# Patient Record
Sex: Male | Born: 1995 | Race: Black or African American | Hispanic: No | Marital: Single | Smoking: Never smoker
Health system: Southern US, Community
[De-identification: ages and names within clinical notes are randomized; demographics above are authoritative.]

---
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
## 2022-12-20 ENCOUNTER — Ambulatory Visit: Payer: Self-pay | Admitting: Nurse Practitioner

## 2023-07-06 ENCOUNTER — Emergency Department (HOSPITAL_COMMUNITY)
Admission: EM | Admit: 2023-07-06 | Discharge: 2023-07-06 | Disposition: A | Discharge status: Home / Self Care | Payer: Self-pay | Attending: Emergency Medicine | Admitting: Emergency Medicine

## 2023-07-06 ENCOUNTER — Encounter (HOSPITAL_COMMUNITY): Payer: Self-pay

## 2023-07-06 ENCOUNTER — Other Ambulatory Visit: Payer: Self-pay

## 2023-07-06 DIAGNOSIS — L308 Other specified dermatitis: Secondary | ICD-10-CM | POA: Insufficient documentation

## 2023-07-06 MED ORDER — TRIAMCINOLONE ACETONIDE 0.1 % EX CREA: 1.0000 | TOPICAL_CREAM | Freq: Two times a day (BID) | CUTANEOUS | 0 refills | Status: AC

## 2023-07-06 MED ORDER — PREDNISONE 10 MG (21) PO TBPK: ORAL_TABLET | Freq: Every day | ORAL | 0 refills | Status: AC

## 2023-07-06 NOTE — ED Provider Notes (Signed)
[facility] AT [facility] Provider Note   CSN: [id number] Arrival date & time: 07/06/23  0932     History  Chief Complaint  Patient presents with   Rash    Marcus Cohen is a 27 y.o. male with past medical history significant for eczema who presents concern for eczema flare.  Patient reports that typically he would get some eczema in elbows and other skin folds but that it has worsened and seems to be affecting almost his entire body.  Patient denies any new detergents, soaps, he reports he moisturizes with Aveeno 2-3 times daily.  He is not currently using any topical steroid.  He denies taking hot showers.  He denies any suspicion for other allergy contacts.  He does not currently have a dermatologist.   Rash      Home Medications Prior to Admission medications   Medication Sig Start Date End Date Taking? Authorizing Provider  predniSONE (STERAPRED UNI-PAK 21 TAB) 10 MG (21) TBPK tablet Take by mouth daily. Take 6 tabs by mouth daily  for 2 days, then 5 tabs for 2 days, then 4 tabs for 2 days, then 3 tabs for 2 days, 2 tabs for 2 days, then 1 tab by mouth daily for 2 days 07/06/23  Yes Marlene Beidler H, PA-C  triamcinolone cream (KENALOG) 0.1 % Apply 1 Application topically 2 (two) times daily. 07/06/23  Yes Memphis Creswell H, PA-C  ibuprofen (ADVIL,MOTRIN) 600 MG tablet Take 1 tablet (600 mg total) by mouth every 8 (eight) hours as needed. 08/25/13   Vale Haven, MD      Allergies    Peanut-containing drug products    Review of Systems   Review of Systems  Skin:  Positive for rash.  All other systems reviewed and are negative.   Physical Exam Updated Vital Signs BP 138/85   Pulse 85   Temp (!) 97.2 F (36.2 C) (Oral)   Resp 16   Ht 6\' 1"  (1.854 m)   Wt 63.5 kg   SpO2 100%   BMI 18.47 kg/m  Physical Exam Vitals and nursing note reviewed.  Constitutional:      General: He is not in acute distress.    Appearance:  Normal appearance.  HENT:     Head: Normocephalic and atraumatic.  Eyes:     General:        Right eye: No discharge.        Left eye: No discharge.  Cardiovascular:     Rate and Rhythm: Normal rate and regular rhythm.     Heart sounds: No murmur heard.    No friction rub. No gallop.  Pulmonary:     Effort: Pulmonary effort is normal.     Breath sounds: Normal breath sounds.  Abdominal:     General: Bowel sounds are normal.     Palpations: Abdomen is soft.  Skin:    General: Skin is warm and dry.     Capillary Refill: Capillary refill takes less than 2 seconds.     Comments: Diffuse dry skin, skin thickening, excoriations, with some postinflammatory hyperpigmentation, skin thickening is worst in flexural areas, spares face, palms.  No vesicular lesions, no evidence of secondary skin infection.  Neurological:     Mental Status: He is alert and oriented to person, place, and time.  Psychiatric:        Mood and Affect: Mood normal.        Behavior: Behavior normal.  ED Results / Procedures / Treatments   Labs (all labs ordered are listed, but only abnormal results are displayed) Labs Reviewed - No data to display  EKG None  Radiology No results found.  Procedures Procedures    Medications Ordered in ED Medications - No data to display  ED Course/ Medical Decision Making/ A&P                                 Medical Decision Making  This patient is a 27 y.o. male who presents to the ED for concern of rash, dry skin.   Differential diagnoses prior to evaluation: Eczema, psoriasis, contact dermatitis, versus other rash, low clinical suspicion for drug reaction with no oral medications, no clinical suspicion for SJS, TEN  Past Medical History / Social History / Additional history: Chart reviewed. Pertinent results include: Eczema, allergies  Physical Exam: Physical exam performed. The pertinent findings include: Diffuse dry skin, skin thickening, excoriations,  with some postinflammatory hyperpigmentation, skin thickening is worst in flexural areas, spares face, palms.  No vesicular lesions, no evidence of secondary skin infection.   Medications / Treatment: Will discharge with triamcinolone and steroid taper given the extent of his eczema at this time, encourage close follow-up with dermatology, daily moisturization, avoiding hot showers, patient understands and agrees to plan   Disposition: After consideration of the diagnostic results and the patients response to treatment, I feel that patient is stable for discharge at this time with plan as above .   emergency department workup does not suggest an emergent condition requiring admission or immediate intervention beyond what has been performed at this time. The plan is: as above. The patient is safe for discharge and has been instructed to return immediately for worsening symptoms, change in symptoms or any other concerns.  Final Clinical Impression(s) / ED Diagnoses Final diagnoses:  Other eczema    Rx / DC Orders ED Discharge Orders          Ordered    triamcinolone cream (KENALOG) 0.1 %  2 times daily        07/06/23 1005    predniSONE (STERAPRED UNI-PAK 21 TAB) 10 MG (21) TBPK tablet  Daily        07/06/23 1005              Olene Floss, PA-C 07/06/23 1008    Jacalyn Lefevre, MD 07/06/23 1105

## 2023-07-06 NOTE — Discharge Instructions (Addendum)
As we discussed your symptoms do seem consistent with worsening eczema, please take the entire course of steroids that I prescribed, you can use the topical steroid in all skin locations other than the face, groin, armpits, or other skin folds for up to 2 weeks at a time, limit use after 2 weeks, at least a 1 week break.  Recommend avoiding hot showers, moisturizing with 2-3 times daily, you may want to use a gentle lotion with no scents such as CeraVe or Cetaphil.  I recommend that you follow-up closely with a dermatologist to discuss additional treatment and management.

## 2023-07-06 NOTE — ED Triage Notes (Signed)
Pt presenting today complaining skin issue. Pt has hx of eczema, rash covering his entire body. Guest of pt unsure if this is a normal eczema flare up or something different. States this issue has been going on for apprx 6 months.

## 2024-07-07 ENCOUNTER — Ambulatory Visit: Payer: Self-pay | Admitting: Orthopaedic Surgery
